# Patient Record
Sex: Male | Born: 2000 | Race: Black or African American | Hispanic: No | Marital: Single | Smoking: Never smoker
Health system: Southern US, Community
[De-identification: ages and names within clinical notes are randomized; demographics above are authoritative.]

## PROBLEM LIST (undated history)

## (undated) DIAGNOSIS — S42009A Fracture of unspecified part of unspecified clavicle, initial encounter for closed fracture: Secondary | ICD-10-CM

## (undated) HISTORY — PX: CIRCUMCISION: SUR203

---
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
## 2023-10-05 ENCOUNTER — Encounter (HOSPITAL_COMMUNITY): Payer: Self-pay

## 2023-10-05 ENCOUNTER — Ambulatory Visit (HOSPITAL_COMMUNITY)
Admission: EM | Admit: 2023-10-05 | Discharge: 2023-10-05 | Disposition: A | Discharge status: Home / Self Care | Payer: Self-pay | Attending: Family Medicine | Admitting: Family Medicine

## 2023-10-05 ENCOUNTER — Ambulatory Visit (INDEPENDENT_AMBULATORY_CARE_PROVIDER_SITE_OTHER): Payer: Self-pay

## 2023-10-05 DIAGNOSIS — M79641 Pain in right hand: Secondary | ICD-10-CM

## 2023-10-05 NOTE — ED Triage Notes (Signed)
 Right hand pain started today. Patient denies any injury to his hand.

## 2023-10-08 NOTE — ED Provider Notes (Signed)
  [facility]   [id number] 10/05/23 Arrival Time: 1729  ASSESSMENT & PLAN:  1. Right hand pain    I have personally viewed and independently interpreted the imaging studies ordered this visit. R hand: no fx appreciated.  Prefers OTC ibuprofen . Activities as tolerated. May f/u as needed.  Reviewed expectations re: course of current medical issues. Questions answered. Outlined signs and symptoms indicating need for more acute intervention. Patient verbalized understanding. After Visit Summary given.  SUBJECTIVE: History from: patient. Eugene Hall is a 23 y.o. male who reports R hand pain s/p punching wood; today. Mild swelling. Denies extremity sensation changes or weakness. No tx PTA.  Past Surgical History:  Procedure Laterality Date   CIRCUMCISION     at birth      OBJECTIVE:  Vitals:   10/05/23 1744  BP: 119/65  Pulse: 84  Resp: 20  Temp: 98.1 F (36.7 C)  TempSrc: Oral  SpO2: 94%    General appearance: alert; no distress HEENT: [place]; AT Neck: supple with FROM Resp: unlabored respirations Extremities: RUE: warm with well perfused appearance; fairly well localized moderate tenderness over right dorsal lateral hand; without gross deformities; swelling: minimal; bruising: none; all fingers with FROM CV: brisk extremity capillary refill of RUE; 2+ radial pulse of RUE. Skin: warm and dry; no visible rashes Neurologic: gait normal; normal sensation and strength of RUE Psychological: alert and cooperative; normal mood and affect  Imaging: DG Hand Complete Right Result Date: 10/05/2023 CLINICAL DATA:  Right hand pain after punching a wooden bench. EXAM: RIGHT HAND - COMPLETE 3+ VIEW COMPARISON:  None Available. FINDINGS: Mild dorsal soft tissue swelling over the metacarpal heads. No acute osseous or joint abnormality. IMPRESSION: No acute osseous or joint abnormality. Electronically Signed   By: Newell Eke M.D.   On: 10/05/2023 18:27        Allergies  Allergen Reactions   Amoxicillin Rash    Past Medical History:  Diagnosis Date   Collar bone fracture 05/30/2008   Right clavicle fracture due to football tackle   Social History   Socioeconomic History   Marital status: Single    Spouse name: Not on file   Number of children: Not on file   Years of education: Not on file   Highest education level: Not on file  Occupational History   Not on file  Tobacco Use   Smoking status: Never   Smokeless tobacco: Never  Substance and Sexual Activity   Alcohol use: No   Drug use: Never   Sexual activity: Yes  Other Topics Concern   Not on file  Social History Narrative   Father lives in [place], [place]; involved. Moved to [place] in 2009.   Social Drivers of Corporate investment banker Strain: Not on file  Food Insecurity: Not on file  Transportation Needs: Not on file  Physical Activity: Not on file  Stress: Not on file  Social Connections: Not on file   Family History  Problem Relation Age of Onset   Diabetes Paternal Grandmother    Diabetes Paternal Grandfather    Scoliosis Mother    Diabetes Paternal Aunt    Past Surgical History:  Procedure Laterality Date   CIRCUMCISION     at birth       Rolinda Rogue, MD 10/08/23 1010
